# Patient Record
Sex: Male | Born: 1990 | Race: Black or African American | Hispanic: No | Marital: Single | Smoking: Current every day smoker
Health system: Southern US, Community
[De-identification: ages and names within clinical notes are randomized; demographics above are authoritative.]

## PROBLEM LIST (undated history)

## (undated) DIAGNOSIS — F4310 Post-traumatic stress disorder, unspecified: Secondary | ICD-10-CM

## (undated) DIAGNOSIS — F32A Depression, unspecified: Secondary | ICD-10-CM

## (undated) DIAGNOSIS — F329 Major depressive disorder, single episode, unspecified: Secondary | ICD-10-CM

## (undated) DIAGNOSIS — F419 Anxiety disorder, unspecified: Secondary | ICD-10-CM

---
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
## 2015-04-27 ENCOUNTER — Encounter (HOSPITAL_COMMUNITY): Payer: Self-pay | Admitting: Emergency Medicine

## 2015-04-27 ENCOUNTER — Emergency Department (HOSPITAL_COMMUNITY)
Admission: EM | Admit: 2015-04-27 | Discharge: 2015-04-27 | Disposition: A | Discharge status: Home / Self Care | Payer: Self-pay | Attending: Emergency Medicine | Admitting: Emergency Medicine

## 2015-04-27 ENCOUNTER — Emergency Department (HOSPITAL_COMMUNITY): Payer: Self-pay

## 2015-04-27 DIAGNOSIS — S62396A Other fracture of fifth metacarpal bone, right hand, initial encounter for closed fracture: Secondary | ICD-10-CM | POA: Insufficient documentation

## 2015-04-27 DIAGNOSIS — Z8659 Personal history of other mental and behavioral disorders: Secondary | ICD-10-CM | POA: Insufficient documentation

## 2015-04-27 DIAGNOSIS — Y9389 Activity, other specified: Secondary | ICD-10-CM | POA: Insufficient documentation

## 2015-04-27 DIAGNOSIS — Z720 Tobacco use: Secondary | ICD-10-CM | POA: Insufficient documentation

## 2015-04-27 DIAGNOSIS — S62306A Unspecified fracture of fifth metacarpal bone, right hand, initial encounter for closed fracture: Secondary | ICD-10-CM

## 2015-04-27 DIAGNOSIS — Y998 Other external cause status: Secondary | ICD-10-CM | POA: Insufficient documentation

## 2015-04-27 DIAGNOSIS — Y9289 Other specified places as the place of occurrence of the external cause: Secondary | ICD-10-CM | POA: Insufficient documentation

## 2015-04-27 HISTORY — DX: Anxiety disorder, unspecified: F41.9

## 2015-04-27 HISTORY — DX: Post-traumatic stress disorder, unspecified: F43.10

## 2015-04-27 HISTORY — DX: Major depressive disorder, single episode, unspecified: F32.9

## 2015-04-27 HISTORY — DX: Depression, unspecified: F32.A

## 2015-04-27 MED ORDER — OXYCODONE-ACETAMINOPHEN 5-325 MG PO TABS
2.0000 | ORAL_TABLET | Freq: Once | ORAL | Status: AC
  Administered 2015-04-27: 2 via ORAL
  Filled 2015-04-27: qty 2

## 2015-04-27 MED ORDER — OXYCODONE-ACETAMINOPHEN 5-325 MG PO TABS: 1.0000 | ORAL_TABLET | ORAL | Status: AC | PRN

## 2015-04-27 MED ORDER — OXYCODONE-ACETAMINOPHEN 5-325 MG PO TABS
ORAL_TABLET | ORAL | Status: AC
  Filled 2015-04-27: qty 2

## 2015-04-27 MED ORDER — IBUPROFEN 800 MG PO TABS: 800.0000 mg | ORAL_TABLET | Freq: Three times a day (TID) | ORAL | Status: AC | PRN
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
## 2015-04-27 NOTE — Discharge Instructions (Signed)
Read the information below.  Use the prescribed medication as directed.  Please discuss all new medications with your pharmacist.  Do not take additional tylenol while taking the prescribed pain medication to avoid overdose.  You may return to the Emergency Department at any time for worsening condition or any new symptoms that concern you.  If you develop uncontrolled pain, weakness or numbness of the extremity, severe discoloration of the skin, or you are unable to move your fingers, return to the ER for a recheck.      Cast or Splint Care Casts and splints support injured limbs and keep bones from moving while they heal. It is important to care for your cast or splint at home.  HOME CARE INSTRUCTIONS  Keep the cast or splint uncovered during the drying period. It can take 24 to 48 hours to dry if it is made of plaster. A fiberglass cast will dry in less than 1 hour.  Do not rest the cast on anything harder than a pillow for the first 24 hours.  Do not put weight on your injured limb or apply pressure to the cast until your health care provider gives you permission.  Keep the cast or splint dry. Wet casts or splints can lose their shape and may not support the limb as well. A wet cast that has lost its shape can also create harmful pressure on your skin when it dries. Also, wet skin can become infected.  Cover the cast or splint with a plastic bag when bathing or when out in the rain or snow. If the cast is on the trunk of the body, take sponge baths until the cast is removed.  If your cast does become wet, dry it with a towel or a blow dryer on the cool setting only.  Keep your cast or splint clean. Soiled casts may be wiped with a moistened cloth.  Do not place any hard or soft foreign objects under your cast or splint, such as cotton, toilet paper, lotion, or powder.  Do not try to scratch the skin under the cast with any object. The object could get stuck inside the cast. Also,  scratching could lead to an infection. If itching is a problem, use a blow dryer on a cool setting to relieve discomfort.  Do not trim or cut your cast or remove padding from inside of it.  Exercise all joints next to the injury that are not immobilized by the cast or splint. For example, if you have a long leg cast, exercise the hip joint and toes. If you have an arm cast or splint, exercise the shoulder, elbow, thumb, and fingers.  Elevate your injured arm or leg on 1 or 2 pillows for the first 1 to 3 days to decrease swelling and pain.It is best if you can comfortably elevate your cast so it is higher than your heart. SEEK MEDICAL CARE IF:   Your cast or splint cracks.  Your cast or splint is too tight or too loose.  You have unbearable itching inside the cast.  Your cast becomes wet or develops a soft spot or area.  You have a bad smell coming from inside your cast.  You get an object stuck under your cast.  Your skin around the cast becomes red or raw.  You have new pain or worsening pain after the cast has been applied. SEEK IMMEDIATE MEDICAL CARE IF:   You have fluid leaking through the cast.  You are  unable to move your fingers or toes.  You have discolored (blue or white), cool, painful, or very swollen fingers or toes beyond the cast.  You have tingling or numbness around the injured area.  You have severe pain or pressure under the cast.  You have any difficulty with your breathing or have shortness of breath.  You have chest pain. Document Released: 09/03/2000 Document Revised: 06/27/2013 Document Reviewed: 03/15/2013 [facility] Patient Information 2015 [place], [place]. This information is not intended to replace advice given to you by your health care provider. Make sure you discuss any questions you have with your health care provider.  Hand Fracture, Fifth Metacarpal The small metacarpal is the bone at the base of the Kass[place] finger between the knuckle and the  wrist. A fracture is a break in that bone. One of the fractures that is common to this bone is called a Boxer's Fracture. TREATMENT These fractures can be treated with:   Reduction (bones moved back into place), then pinned through the skin to maintain the position, and then casted for about 6 weeks or as your caregiver determines necessary.  ORIF (open reduction and internal fixation) - the fracture site is opened and the bone pieces are fixed into place with pins and then casted for approximately 6 weeks or as your caregiver determines necessary. Your caregiver will discuss the type of fracture you have and the treatment that should be best for that problem. If surgery is the treatment of choice, the following is information for you to know, and also let your caregiver know about prior to surgery.  LET YOUR CAREGIVER KNOW ABOUT:  Allergies.  Medications taken including herbs, eye drops, over the counter medications, and creams.  Use of steroids (by mouth or creams).  Previous problems with anesthetics or novocaine.  Possibility of pregnancy, if this applies.  History of blood clots (thrombophlebitis).  History of bleeding or blood problems.  Previous surgery.  Other health problems. AFTER THE PROCEDURE After surgery, you will be taken to the recovery area where a nurse will watch and check your progress. Once you're awake, stable, and taking fluids well, barring other problems you'll be allowed to go home. Once home an ice pack applied to your operative site may help with discomfort and keep the swelling down. HOME CARE INSTRUCTIONS   Follow your caregiver's instructions as to activities, exercises, physical therapy, and driving a car.  Daily exercise is helpful for maintaining range of motion (movement and mobility) and strength. Exercise as instructed.  To lessen swelling, keep the injured hand elevated above the level of your heart as much as possible.  Apply ice to the  injury for 15-20 minutes each hour while awake for the first 2 days. Put the ice in a plastic bag and place a thin towel between the bag of ice and your cast.  Move the fingers of your casted hand at least several times a day.  If a plaster or fiberglass cast was applied:  Do not try to scratch the skin under the cast using a sharp or pointed object.  Check the skin around the cast every day. You may put lotion on red or sore areas.  Keep your cast dry. Your cast can be protected during bathing with a plastic bag. Do not put your cast into the water.  If a plaster splint was applied:  Wear the splint for as long as directed by your caregiver or until seen for follow-up examination.  Do not get  your splint wet. Protect it during bathing with a plastic bag.  You may loosen the elastic bandage around the splint if your fingers start to get numb, tingle, get cold or turn blue.  Do not put pressure on your cast or splint; this may cause it to break. Especially, do not lean plaster casts on hard surfaces for 24 hours after application.  Take medications as directed by your caregiver.  Only take over-the-counter or prescription medicines for pain, discomfort, or fever as directed by your caregiver.  Follow all instructions for physician referrals, physical therapy, and rehabilitation. Any delay in obtaining necessary care could result in permanent injury, disability and chronic pain. SEEK MEDICAL CARE IF:   Increased bleeding (more than a small spot) from the wound or from beneath your cast or splint if there is a wound beneath the cast from surgery.  Redness, swelling, or increasing pain in the wound or from beneath your cast or splint.  Pus coming from wound or from beneath your cast or splint.  An unexplained oral temperature above 102 F (38.9 C) develops.  A foul smell coming from the wound or dressing or from beneath your cast or splint.  You are unable to move your Mensing  finger. SEEK IMMEDIATE MEDICAL CARE IF:  You develop a rash, have difficulty breathing, or have any allergy problems. If you do not have a window in your cast for observing the wound, a discharge or minor bleeding may show up as a stain on the outside of your cast. Report these findings to your caregiver. MAKE SURE YOU:   Understand these instructions.  Will watch your condition.  Will get help right away if you are not doing well or get worse. Document Released: 12/13/2000 Document Revised: 11/29/2011 Document Reviewed: 04/25/2008 [facility] Patient Information 2015 [place], [place]. This information is not intended to replace advice given to you by your health care provider. Make sure you discuss any questions you have with your health care provider.

## 2015-04-29 ENCOUNTER — Other Ambulatory Visit: Payer: Self-pay | Admitting: Orthopedic Surgery

## 2015-05-05 ENCOUNTER — Ambulatory Visit (HOSPITAL_BASED_OUTPATIENT_CLINIC_OR_DEPARTMENT_OTHER): Admission: RE | Admit: 2015-05-05 | Payer: Self-pay | Source: Ambulatory Visit | Admitting: Orthopedic Surgery

## 2015-05-05 ENCOUNTER — Encounter (HOSPITAL_BASED_OUTPATIENT_CLINIC_OR_DEPARTMENT_OTHER): Payer: Self-pay | Admitting: Anesthesiology

## 2015-05-05 ENCOUNTER — Encounter (HOSPITAL_BASED_OUTPATIENT_CLINIC_OR_DEPARTMENT_OTHER): Admission: RE | Payer: Self-pay | Source: Ambulatory Visit

## 2015-05-05 SURGERY — CLOSED REDUCTION, FRACTURE, METACARPAL BONE, WITH PERCUTANEOUS PINNING
Anesthesia: Choice | Laterality: Right

## 2015-05-07 ENCOUNTER — Ambulatory Visit: Payer: Self-pay | Attending: Orthopedic Surgery | Admitting: Occupational Therapy

## 2015-05-07 ENCOUNTER — Telehealth: Payer: Self-pay | Admitting: Occupational Therapy

## 2015-05-07 NOTE — Telephone Encounter (Signed)
Dr. Merlyn Lot, Joseph Baxter DOB Jul 18, 1991 no-showed for his OT appointment today 05/07/15. His mailbox is full and unable to accept messages, therefore he does not have a splint. Shelda Jakes, OTR/L [phone] Phone: [phone] 5:54 PM 05/07/2015
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
